# Patient Record
Sex: Male | Born: 1990 | Race: White | Hispanic: No | Marital: Single | Smoking: Current every day smoker
Health system: Southern US, Community
[De-identification: ages and names within clinical notes are randomized; demographics above are authoritative.]

## PROBLEM LIST (undated history)

## (undated) DIAGNOSIS — K769 Liver disease, unspecified: Secondary | ICD-10-CM

## (undated) DIAGNOSIS — N289 Disorder of kidney and ureter, unspecified: Secondary | ICD-10-CM

## (undated) DIAGNOSIS — F1110 Opioid abuse, uncomplicated: Secondary | ICD-10-CM

## (undated) DIAGNOSIS — K759 Inflammatory liver disease, unspecified: Secondary | ICD-10-CM

## (undated) HISTORY — PX: OTHER SURGICAL HISTORY: SHX169

## (undated) HISTORY — PX: SHOULDER SURGERY: SHX246

## (undated) HISTORY — PX: APPENDECTOMY: SHX54

---

## 2014-09-10 ENCOUNTER — Emergency Department
Admission: EM | Admit: 2014-09-10 | Discharge: 2014-09-10 | Disposition: A | Discharge status: Home / Self Care | Payer: BLUE CROSS/BLUE SHIELD | Attending: Emergency Medicine | Admitting: Emergency Medicine

## 2014-09-10 DIAGNOSIS — R197 Diarrhea, unspecified: Secondary | ICD-10-CM | POA: Diagnosis not present

## 2014-09-10 DIAGNOSIS — M791 Myalgia: Secondary | ICD-10-CM | POA: Insufficient documentation

## 2014-09-10 DIAGNOSIS — F1123 Opioid dependence with withdrawal: Secondary | ICD-10-CM | POA: Insufficient documentation

## 2014-09-10 DIAGNOSIS — Z720 Tobacco use: Secondary | ICD-10-CM | POA: Diagnosis not present

## 2014-09-10 DIAGNOSIS — Z760 Encounter for issue of repeat prescription: Secondary | ICD-10-CM | POA: Diagnosis present

## 2014-09-10 DIAGNOSIS — R112 Nausea with vomiting, unspecified: Secondary | ICD-10-CM | POA: Insufficient documentation

## 2014-09-10 DIAGNOSIS — R109 Unspecified abdominal pain: Secondary | ICD-10-CM | POA: Diagnosis not present

## 2014-09-10 DIAGNOSIS — F1193 Opioid use, unspecified with withdrawal: Secondary | ICD-10-CM

## 2014-09-10 DIAGNOSIS — R251 Tremor, unspecified: Secondary | ICD-10-CM | POA: Insufficient documentation

## 2014-09-10 HISTORY — DX: Inflammatory liver disease, unspecified: K75.9

## 2014-09-10 HISTORY — DX: Disorder of kidney and ureter, unspecified: N28.9

## 2014-09-10 HISTORY — DX: Liver disease, unspecified: K76.9

## 2014-09-10 MED ORDER — CLONIDINE HCL 0.1 MG PO TABS
ORAL_TABLET | ORAL | Status: AC
  Filled 2014-09-10: qty 1

## 2014-09-10 MED ORDER — CLONIDINE HCL 0.1 MG PO TABS
0.2000 mg | ORAL_TABLET | ORAL | Status: AC
Start: 2014-09-10 — End: 2014-09-10
  Administered 2014-09-10: 0.2 mg via ORAL

## 2014-09-10 MED ORDER — CLONIDINE HCL 0.1 MG PO TABS
ORAL_TABLET | ORAL | Status: AC
  Filled 2014-09-10: qty 2

## 2014-09-10 MED ORDER — ONDANSETRON 8 MG PO TBDP
ORAL_TABLET | ORAL | Status: AC
  Filled 2014-09-10: qty 1

## 2014-09-10 MED ORDER — PROMETHAZINE HCL 25 MG/ML IJ SOLN
INTRAMUSCULAR | Status: AC
  Filled 2014-09-10: qty 1

## 2014-09-10 MED ORDER — PROMETHAZINE HCL 25 MG/ML IJ SOLN
25.0000 mg | Freq: Once | INTRAMUSCULAR | Status: AC
  Administered 2014-09-10: 25 mg via INTRAVENOUS

## 2014-09-10 MED ORDER — ONDANSETRON 8 MG PO TBDP
8.0000 mg | ORAL_TABLET | Freq: Once | ORAL | Status: AC
  Administered 2014-09-10: 8 mg via ORAL

## 2014-09-10 NOTE — ED Notes (Signed)
Pt reports he has been on Soboxone for 4 months and has not had any since Friday. pt is extremely anxious, reports he is unable to walk despite walking into ER. Pt reports vomiting but currently drinking with NAD.

## 2014-09-10 NOTE — ED Provider Notes (Signed)
[facility] Emergency Department Provider Note  ____________________________________________  Time seen: Approximately 4:06 AM  I have reviewed the triage vital signs and the nursing notes.   HISTORY  Chief Complaint Medication Refill    HPI Ronald Pham is a 24 y.o. 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  He presents because he had his usual Suboxone on Friday (2 days ago) but did not have any for the weekend.  He has been suffering from narcotics withdrawal as a result, including nausea and vomiting, diarrhea, and pain all over his body.  He is tremulous but has no neurological deficits and no sign of delirium or altered mental status.   Past Medical History  Diagnosis Date  . Hepatitis   . Renal disorder   . Liver disease     There are no active problems to display for this patient.   Past Surgical History  Procedure Laterality Date  . Appendectomy    . Shoulder surgery    . Knee surgery bilaterally      No current outpatient prescriptions on file.  Allergies Review of patient's allergies indicates no known allergies.  No family history on file.  Social History History  Substance Use Topics  . Smoking status: Current Every Day Smoker    Types: Cigarettes  . Smokeless tobacco: Not on file  . Alcohol Use: No    Review of Systems Constitutional: Subjectively he feels hot and is sweaty.  Complains of myalgias Eyes: No visual changes. ENT: No sore throat. Cardiovascular: Denies chest pain. Respiratory: Denies shortness of breath. Gastrointestinal: Abdominal pain with nausea, vomiting, and diarrhea  Genitourinary: Negative for dysuria. Musculoskeletal: Negative for back pain. Skin: Negative for rash. Neurological: Negative for headaches, focal weakness or numbness.  10-point ROS otherwise negative.  ____________________________________________   PHYSICAL EXAM:  VITAL  SIGNS: ED Triage Vitals  Enc Vitals Group     BP 09/10/14 0321 115/90 mmHg     Pulse Rate 09/10/14 0321 89     Resp 09/10/14 0321 20     Temp 09/10/14 0321 97.6 F (36.4 C)     Temp Source 09/10/14 0321 Oral     SpO2 09/10/14 0321 96 %     Weight 09/10/14 0318 180 lb (81.647 kg)     Height 09/10/14 0318 6\' 4"  (1.93 m)     Head Cir --      Peak Flow --      Pain Score 09/10/14 0318 10     Pain Loc --      Pain Edu? --      Excl. in GC? --     Constitutional: Tremulous and in mild distress.  Grimaces in pain frequently Eyes: Conjunctivae are normal. PERRL. EOMI. Head: Atraumatic. Nose: No congestion/rhinnorhea. Mouth/Throat: Mucous membranes are moist.  Oropharynx non-erythematous. Neck: No stridor.   Cardiovascular: Normal rate, regular rhythm. Grossly normal heart sounds.  Good peripheral circulation. Respiratory: Normal respiratory effort.  No retractions. Lungs CTAB. Gastrointestinal: Soft and nontender. No distention. No abdominal bruits. No CVA tenderness. Musculoskeletal: No lower extremity tenderness nor edema.  No joint effusions. Neurologic:  Normal speech and language. No gross focal neurologic deficits are appreciated. Speech is normal. No gait instability. Skin:  Skin is warm, dry and intact. No rash noted. Psychiatric: Mood and affect are normal. Speech and behavior are normal.  ____________________________________________   LABS (all labs ordered are listed, but only abnormal results are displayed)  Not indicated   ____________________________________________   INITIAL IMPRESSION / ASSESSMENT AND PLAN / ED COURSE  Pertinent labs & imaging results that were available during my care of the patient were reviewed by me and considered in my medical decision making (see chart for details).  I explained that I will treat his symptoms with clonidine and Zofran, but that I cannot administer Suboxone nor write a prescription.  The patient understands this and will  follow up in several hours with his pharmacy.  There is no dictation for additional intervention at this time. ____________________________________________   FINAL CLINICAL IMPRESSION(S) / ED DIAGNOSES  Final diagnoses:  Acute opioid withdrawal     Loleta Roseory Harrol Novello, MD 09/10/14 [phone]

## 2014-09-10 NOTE — Discharge Instructions (Signed)
Opioid Withdrawal Opioids are a group of narcotic drugs. They include the street drug heroin. They also include pain medicines, such as morphine, hydrocodone, oxycodone, and fentanyl. Opioid withdrawal is a group of characteristic physical and mental signs and symptoms. It typically occurs if you have been using opioids daily for several weeks or longer and stop using or rapidly decrease use. Opioid withdrawal can also occur if you have used opioids daily for a long time and are given a medicine to block the effect.  SIGNS AND SYMPTOMS Opioid withdrawal includes three or more of the following symptoms:   Depressed, anxious, or irritable mood.  Nausea or vomiting.  Muscle aches or spasms.   Watery eyes.   Runny nose.  Dilated pupils, sweating, or hairs standing on end.  Diarrhea or intestinal cramping.  Yawning.   Fever.  Increased blood pressure.  Fast pulse.  Restlessness or trouble sleeping. These signs and symptoms occur within several hours of stopping or reducing short-acting opioids, such as heroin. They can occur within 3 days of stopping or reducing long-acting opioids, such as methadone. Withdrawal begins within minutes of receiving a drug that blocks the effects of opioids, such as naltrexone or naloxone. DIAGNOSIS  Opioid use disorder is diagnosed by your health care provider. You will be asked about your symptoms, drug and alcohol use, medical history, and use of medicines. A physical exam may be done. Lab tests may be ordered. Your health care provider may have you see a mental health professional.  TREATMENT  The treatment for opioid withdrawal is usually provided by medical doctors with special training in substance use disorders (addiction specialists). The following medicines may be included in treatment:  Opioids given in place of the abused opioid. They turn on opioid receptors in the brain and lessen or prevent withdrawal symptoms. They are gradually  decreased (opioid substitution and taper).  Non-opioids that can lessen certain opioid withdrawal symptoms. They may be used alone or with opioid substitution and taper. Successful long-term recovery usually requires medicine, counseling, and group support. HOME CARE INSTRUCTIONS   Take medicines only as directed by your health care provider.  Check with your health care provider before starting new medicines.  Keep all follow-up visits as directed by your health care provider. SEEK MEDICAL CARE IF:  You are not able to take your medicines as directed.  Your symptoms get worse.  You relapse. SEEK IMMEDIATE MEDICAL CARE IF:  You have serious thoughts about hurting yourself or others.  You have a seizure.  You lose consciousness. Document Released: May 12, 202004 Document Revised: 09/04/2013 Document Reviewed: 05/03/2013 [facility] Patient Information 2015 [place], [place]. This information is not intended to replace advice given to you by your health care provider. Make sure you discuss any questions you have with your health care provider.  [facility] Chronic Pain Policy  Emergency care providers appreciate that many patients coming to us are in severe pain and we wish to address their pain in the safest, most responsible manner.  It is important to recognize, however, that the proper treatment of chronic pain differs from that of the pain of injuries and acute illnesses.  Our goal is to provider quality, safe, personalized care and we thank you for giving us the opportunity to serve you.  The use of narcotics and related agents for chronic pain syndromes may lead to additional physical and psychological problems.  Nearly as many people die from prescription narcotics each year as die from car crashes.  Additionally, this  risk is increased if such prescriptions are obtained from a variety of sources.  Therefore, only your primary care physician or a pain management specialist is able to  safely treat such syndromes with narcotic medications long-term.  Documentation revealing such prescriptions have been sought from multiple sources may prohibit us from providing a refill or different narcotic medication.  Your name may be checked first through the [facility] Controlled Substances Reporting System.  This database is a record of controlled substance medication prescriptions that the patient has received.  This has been established by [facility] in an effort to eliminate the dangerous, and often life-threatening, practice of obtaining multiple prescriptions from different medical providers.  If you have a chronic pain syndrome (i.e. chronic headaches, recurrent back or neck pain, dental pain, abdominal or pelvic pain without a specific diagnosis, or neuropathic pain such as fibromyalgia) or recurrent visits for the same condition without an acute diagnosis, you may be treated with non-narcotics and other non-addictive medicines.  Allergic reactions or negative side effects that may be reported by a patient to such medications will not typically lead to the use of a narcotic analgesic or other controlled substance as an alternative.  Patients managing chronic pain with a personal physician should have provisions in place for breakthrough pain.  If you are in crisis, you should call your physician.  If your physician directs you to the emergency department, please have the doctor call and speak to our attending physician concerning your care.  When patients come to the Emergency Department (ED) with acute medical conditions in which the ED physician feels it is appropriate to prescribe narcotic or sedating pain medication, the physician will prescribe these in very limited quantities.  The amount of these medications will last only until you can see your primary care physician in his/her office.  Any patient who returns to the ED seeking refills should expect only non-narcotic pain  medications.  In the event an acute medical condition exists and the emergency physician feels it is necessary that the patient be given a narcotic or sedating medication, a responsible adult driver should be present in the room prior to the medication being given by the nurse.  Prescriptions for narcotic or sedating medications that have been lost, stolen, or expired will NOT be refilled in the ED.  Patients who have chronic pain may receive non-narcotic prescriptions until seen by their primary care physician.  It is every patient's personal responsibility to maintain active prescriptions with his or her primary care physician or specialist.

## 2017-01-05 ENCOUNTER — Ambulatory Visit: Payer: BLUE CROSS/BLUE SHIELD

## 2017-01-05 DIAGNOSIS — Z006 Encounter for examination for normal comparison and control in clinical research program: Secondary | ICD-10-CM

## 2017-05-05 ENCOUNTER — Ambulatory Visit: Payer: BLUE CROSS/BLUE SHIELD | Attending: Nephrology

## 2017-05-06 ENCOUNTER — Ambulatory Visit: Payer: BLUE CROSS/BLUE SHIELD | Attending: Nephrology

## 2017-05-06 DIAGNOSIS — M6282 Rhabdomyolysis: Secondary | ICD-10-CM

## 2017-05-06 NOTE — Progress Notes
NEPHROLOGY CONSULTATION NOTE      05/06/2017    Patient: Isaac Allen  MRN: [id number]    REFERRING PHYSICIAN: [name]*    Reason for Consultation:  Elevated Creat    Chief Complaints:   Chief Complaint   Patient presents with   ??? Establish Care     kidney failure        History of Present Illness:  27 yr male with PMH of heroin abuse recently admitted to [facility] for Rhabdomyolysis following one time IV heroin use came to renal clinic for evaluation. He mentioned that he stopped using heroin for years and he was clean till about 3 weeks ago that he used one time IV heroin and subsequently developed Rhabdo and admitted to hospital for 5 days. He did not have any RRT during his hospitalizations but mentioned he was told that his kidney was affected. Based on old records he had normal kidney function with Cr=0.8 in Sep 2018.   Currently No new complaints. Denies chest pain, shortness of breath nausea and vomiting, fever and chills, constipation and diarrhea, dysuria, frequency, hematuria, hesitancy, or other urinary symptoms         Past Medical History:  No past medical history on file.      Past Surgical History:  Heroin abuse  Family History:  No renal related family Hx  Social History:  Social drinker  Hx of heroin abuse  Allergies:  Patient has no known allergies.    Medications:  Continuous Medications:    Scheduled Medications:    PRN Medications:     Review of Systems:  A 14-point review of systems was completed and was negative other than what is presented in the HPI.    Physical Examination:  Vital Signs:   Last Recorded Vital Signs:    05/06/17 1441   BP: 116/65   Pulse: 86   Resp: 19   Temp: 36.8 ???C (98.2 ???F)   SpO2: 97%     @IOLAST3SHIFTS @  @IOTHISSHIFT @    Gen: Alert and cooperative, No acute distress   Eyes: Conjunctiva clear, sclera anicteric  Neck: Supple, no thyroid tenderness   CVS: S1/S2 normal, no tachycardia or bradycardia, no pedal edema Pulm: Clear to auscultation bilaterally, normal work of breathing  GI: Abdomen soft, nontender, no organomegaly appreciated  Extremities: No joint effusions, atraumatic, normal gait  Skin: No rashes or nodules, normal turgor, warm to touch  Neuro: CN II-XII intact, sensation intact,   Lymph: No cervical lymphadenopathy, no axillary lymphadenopathy    Laboratory Data:  Lab Results   Component Value Date    WBC 4.79 01/05/2017    HGB 13.0 (L) 01/05/2017    HCT 40.8 01/05/2017    PLT 219 01/05/2017     Lab Results   Component Value Date    NA 144 01/05/2017    K 4.6 01/05/2017    CO2 26 01/05/2017    BUN 11 01/05/2017    CREAT 0.86 01/05/2017    GLUCOSE 92 01/05/2017    CALCIUM 9.5 01/05/2017     Lab Results   Component Value Date    BUN 11 01/05/2017     Lab Results   Component Value Date    CREAT 0.86 01/05/2017     No results found for: [employer], PHUR, BLDUR, BILIUR, KETONESUR, GLUCOSEUR, PROTCLUR, NITRITEUR, LEUKESTUR, RBCSUR, WBCSUR  urine electrolytes: No results found for: Debbe Mounts, KRANUR, CLRANUR, CREATRANUR  No results found for: CKTOT, CKMB, TROPONIN  No results  found for: TPCREAT  No results found for: OSMUR  No results found for: EOSUR    Studies:   No results found for this or any previous visit.  Imaging reviewed     Assessment:  27 yr male with PMH of heroin abuse recently admitted to [facility] for Rhabdomyolysis following one time IV heroin use came to renal clinic for evaluation.    -Hx of recent Rhabdomyolysis: with possible AKI as per the Pt    -Hx of Heroin abuse    -Hx of HTN: now off medication, BP normal    Recommendations:  -check CBC, CMP, Phos, UA, UPC  -check Renal US  -check BP at home  -Recommend 0.8g/kg of protein intake for estimated gfr <60  -Avoid nephrotoxins including NSAIDs and chinese herbs  -renally dose all medications to estimated GFR  -If estimated GFR <30, avoid gadolinium exposure with MRI. -avoid iodinated contrast, if possible.  If contrast is needed, please contact us for recommendations.      RTC in 2 weeks    I spent a total of 45 minutes face to face with the patient of which greater than 50% of that time was spent in counseling/coordination.  Topics of my discussion are in my note.       Nat Christen, MD    CC: Jannet Askew, S*

## 2017-05-18 ENCOUNTER — Inpatient Hospital Stay: Payer: BLUE CROSS/BLUE SHIELD | Attending: Nephrology

## 2017-05-18 DIAGNOSIS — M6282 Rhabdomyolysis: Secondary | ICD-10-CM

## 2017-05-21 ENCOUNTER — Ambulatory Visit: Payer: BLUE CROSS/BLUE SHIELD | Attending: Nephrology

## 2017-05-21 DIAGNOSIS — M6282 Rhabdomyolysis: Secondary | ICD-10-CM

## 2017-05-21 NOTE — Progress Notes
NEPHROLOGY CONSULTATION NOTE      05/21/2017    Patient: Isaac Allen  MRN: [id number]    REFERRING PHYSICIAN: [name]*    Reason for Consultation:  Elevated Creat    Chief Complaints:   Chief Complaint   Patient presents with   ??? labs and [place]  result        History of Present Illness:  27 yr male with PMH of heroin abuse recently admitted to [facility] for Rhabdomyolysis following one time IV heroin use came to renal clinic for evaluation. He mentioned that he stopped using heroin for years and he was clean till about 3 weeks ago that he used one time IV heroin and subsequently developed Rhabdo and admitted to hospital for 5 days. He did not have any RRT during his hospitalizations but mentioned he was told that his kidney was affected. Based on old records he had normal kidney function with Cr=0.8 in Sep 2018.     -----------interval history----------  Currently No new complaints. Denies chest pain, shortness of breath nausea and vomiting, fever and chills, constipation and diarrhea, dysuria, frequency, hematuria, hesitancy, or other urinary symptoms         Past Medical History:  No past medical history on file.      Past Surgical History:  Heroin abuse  Family History:  No renal related family Hx  Social History:  Social drinker  Hx of heroin abuse  Allergies:  Patient has no known allergies.    Medications:  Continuous Medications:    Scheduled Medications:    PRN Medications:     Review of Systems:  A 14-point review of systems was completed and was negative other than what is presented in the HPI.    Physical Examination:  Vital Signs:   Last Recorded Vital Signs:    05/21/17 1421   BP: 117/66   Pulse: 78   Resp: 16   Temp: 37.1 ???C (98.7 ???F)   SpO2: 95%     @IOLAST3SHIFTS @  @IOTHISSHIFT @    Gen: Alert and cooperative, No acute distress   Eyes: Conjunctiva clear, sclera anicteric  Neck: Supple, no thyroid tenderness   CVS: S1/S2 normal, no tachycardia or bradycardia, no pedal edema Pulm: Clear to auscultation bilaterally, normal work of breathing  GI: Abdomen soft, nontender, no organomegaly appreciated  Extremities: No joint effusions, atraumatic, normal gait  Skin: No rashes or nodules, normal turgor, warm to touch  Neuro: CN II-XII intact, sensation intact,   Lymph: No cervical lymphadenopathy, no axillary lymphadenopathy    Laboratory Data:  Lab Results   Component Value Date    WBC 8.25 05/06/2017    HGB 15.8 05/06/2017    HCT 46.3 05/06/2017    PLT 325 05/06/2017     Lab Results   Component Value Date    NA 142 05/06/2017    K 4.5 05/06/2017    CO2 20 05/06/2017    BUN 14 05/06/2017    CREAT 0.93 05/06/2017    GLUCOSE 84 05/06/2017    CALCIUM 9.8 05/06/2017    PHOS 3.6 05/06/2017     Lab Results   Component Value Date    BUN 14 05/06/2017    BUN 11 01/05/2017     Lab Results   Component Value Date    CREAT 0.93 05/06/2017    CREAT 0.86 01/05/2017     Lab Results   Component Value Date    SPECGRAVUR 1.024 05/06/2017    PHUR 7.0 05/06/2017  BLDUR Negative 05/06/2017    BILIUR Negative 05/06/2017    KETONESUR 1+ (A) 05/06/2017    GLUCOSEUR Negative 05/06/2017    PROTCLUR Negative 05/06/2017    NITRITEUR Negative 05/06/2017    LEUKESTUR Negative 05/06/2017    RBCSUR 1 05/06/2017    WBCSUR 1 05/06/2017     urine electrolytes:   Lab Results   Component Value Date    CREATRANUR 84.3 05/06/2017    CREATRANUR 84.3 05/06/2017     No results found for: CKTOT, CKMB, TROPONIN  Lab Results   Component Value Date    TPCREAT 0.1 05/06/2017     No results found for: OSMUR  No results found for: EOSUR    Studies:   Renal US= unremarkable    Assessment:  27 yr male with PMH of heroin abuse recently admitted to [facility] for Rhabdomyolysis following one time IV heroin use came to renal clinic for evaluation.    -Hx of recent Rhabdomyolysis: now resolved  With Normal renal function  Normal renal US    -Hx of Heroin abuse    -Hx of elevated BP: now normal , off meds    Recommendations: -normal renal functions  -encourage enough hydration  -avoid IV drug  -avoid NSAIDs        RTC in PRN    I spent a total of 25 minutes face to face with the patient of which greater than 50% of that time was spent in counseling/coordination.  Topics of my discussion are in my note.       Nat Christen, MD    CC: Jannet Askew, S*

## 2018-07-11 ENCOUNTER — Ambulatory Visit: Payer: BLUE CROSS/BLUE SHIELD | Attending: Sports Medicine

## 2018-07-12 ENCOUNTER — Ambulatory Visit: Payer: BLUE CROSS/BLUE SHIELD | Attending: Sports Medicine

## 2018-07-18 ENCOUNTER — Inpatient Hospital Stay: Payer: BLUE CROSS/BLUE SHIELD

## 2018-07-18 ENCOUNTER — Ambulatory Visit: Payer: BLUE CROSS/BLUE SHIELD

## 2018-07-18 DIAGNOSIS — M25561 Pain in right knee: Secondary | ICD-10-CM

## 2018-07-18 DIAGNOSIS — Z719 Counseling, unspecified: Secondary | ICD-10-CM

## 2018-07-18 DIAGNOSIS — M25562 Pain in left knee: Secondary | ICD-10-CM

## 2018-07-18 DIAGNOSIS — G8929 Other chronic pain: Secondary | ICD-10-CM

## 2018-07-18 NOTE — Consults
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 Because of subluxating patellae at the age 3, he had bilateral releases arthroscopically of both knees in a [facility]. He wore a soft cast for 6 months and then he began to improve with a lessening of pain.Marland Kitchen He has never had complete, true dislocation, but tracking of his patellae was noted to be laterally.     Beginning approximately 2 years ago he began having ''cracking'' in both knees with pain. He reports also feels cracking in both ankles. At times he has symptoms in the joints of his fingers and thumb and wrist. He has been told at one time that tests have suggested a possible autoimmune condition. He reports that he very vigorously has tried an exercise program to strengthen the musculature to both lower extremities, but in spite of same he still has pain in both knees. Ascending and descending stairs particularly causes pain. Walking does not cause pain. Any physical activity with impact causes pain. He reports a month ago he had swelling of his right knee circumferentially, including the posterior aspect. He states the pain is severe in both knees, is generalized and as stated includes  the entire circumference of both knees.    EXAMINATION:  Patient demonstrates a normal gait. Examination in the stance phase reveals no malalignment. Examination of his patellae reveals lateral tracking, particularly of his right knee. There is no undue laxity on palpation of his patellae in various degrees of motion. He has minimal subpatellar crepitation of both knees. Compression test did not elicit complaints of pain. The ligamentous structures of both knees were tested at 10, 20, and 40 degrees of flexion and noted to be stable. Anterior and posterior drawers tests, Lachman test negative for instability. Range of motion of both knees is from full active extension to 0 and flexion to 140. Range of motion of the hips is without pain. He demonstrates ''cracking'' with his right ankle on repeated motion, stated to be pain-free. He has good pedal pulses.  He demonstrates excellent muscle strength of all musculature to both lower extremities including quadriceps muscles of both lower extremities.  IMAGING:  X-rays were obtained of both knees and he does have very mild narrowing of the patellofemoral compartment of the right knee. There may be very slight subluxation of the right patella laterally as seen on the sunrise view.    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    DISCUSSION:  The patient's pain is stated to be very severe in spite of his vigorous exercises and very strong appearing-quad and hamstring musculature. He likely has some patellofemoral chondromalacia as a cause of some of his pain, but as we discussed because of his other complaints of other joints he should be evaluated by a rheumatologist. At this time, certainly he is not a candidate for any additional orthopedic procedures and I encouraged him on the importance of maintaining the strength of his lower extremities, and avoiding impact   activities. I did not schedule any further appointments, but he is welcome to return if he develops any new symptoms.      Severiano Gilbert, MD [phone])        PA/MODL CONF#: [id number]  D: 07/18/2018 11:30:42 T: 07/18/2018 11:45:07 DOCUMENT: [id number]

## 2018-07-18 NOTE — Progress Notes
Primary care Physician:   Adolm Joseph, MD  [phone]  [place] [address]    Referred by:  No referring provider defined for this encounter.    Chief orthopedic complaint:  Chief Complaint   Patient presents with   ??? New Consult     bilateral knee pain        Past Medical History:   Patient No past medical history on file.    Past Surgical History:   Patient No past surgical history on file.    Family History:   Patient No family history on file.    Social History:   Patient No past surgical history on file.    Allergies:   No Known Allergies    Medication:   Current Outpatient Medications   Medication Sig   ??? gabapentin 800 mg tablet two (2) times daily.   ??? testosterone cypionate (DEPO-TESTOSTERONE) 200 mg/mL injection Inject into the muscle every fourteen (14) days.   ??? venlafaxine 150 mg 24 hr capsule two (2) times daily.     No current facility-administered medications for this visit.        Diagnosis:   1. Chronic pain of right knee  XR knee ap+lat+sunrise right (3 views)   2. Chronic pain of left knee  CANCELED: XR knee ap+lat+sunrise left (3 views)           Review of Systems;    Patient was given a 14 point review of systems check list and I reviewed the findings noted.    Imaging:    Mr Knee Right External Image Import Comparison    Result Date: 07/18/2018  This order is used to store images obtained from non [facility] imaging facilities in PACS. This order is intended for comparison purposes only and no formal result will be rendered.    Xr Knee Ap+lat+sunrise Bilat (3 Views Ea)    Result Date: 07/18/2018  XR KNEE AP LAT SUNRISE BILAT 3V INDICATION:  ''chronic pain'' COMPARISON: None     IMPRESSION: There are no acute fractures.  There is mild spurring and joint space narrowing of the right patellofemoral compartment. There is soft tissue swelling of the right anterior knee. No joint effusion is seen. I, Evangeline Gula, MD have reviewed this radiological study, and I am in full agreement with the interpretation of the report presented here.   Dictated by: Adah Perl   07/18/2018 11:05 AM Signed by: Evangeline Gula   07/18/2018 11:13 AM      Labs:        History of the present illness;    See dictated note    Orthopedic Diagnosis;  1. Chondromalacia Patella Femoral Compartment Knees    Treatment plan;      See dictated note for details;

## 2018-12-18 ENCOUNTER — Other Ambulatory Visit: Payer: Self-pay

## 2018-12-18 ENCOUNTER — Encounter: Payer: Self-pay | Admitting: Emergency Medicine

## 2018-12-18 ENCOUNTER — Emergency Department
Admission: EM | Admit: 2018-12-18 | Discharge: 2018-12-21 | Discharge status: Against Medical Advice | Payer: BLUE CROSS/BLUE SHIELD | Attending: Emergency Medicine | Admitting: Emergency Medicine

## 2018-12-18 ENCOUNTER — Emergency Department: Payer: BLUE CROSS/BLUE SHIELD

## 2018-12-18 DIAGNOSIS — F17210 Nicotine dependence, cigarettes, uncomplicated: Secondary | ICD-10-CM | POA: Insufficient documentation

## 2018-12-18 DIAGNOSIS — R112 Nausea with vomiting, unspecified: Secondary | ICD-10-CM | POA: Diagnosis not present

## 2018-12-18 DIAGNOSIS — Z5321 Procedure and treatment not carried out due to patient leaving prior to being seen by health care provider: Secondary | ICD-10-CM | POA: Diagnosis not present

## 2018-12-18 DIAGNOSIS — T404X1A Poisoning by other synthetic narcotics, accidental (unintentional), initial encounter: Secondary | ICD-10-CM | POA: Insufficient documentation

## 2018-12-18 DIAGNOSIS — R51 Headache: Secondary | ICD-10-CM | POA: Insufficient documentation

## 2018-12-18 DIAGNOSIS — T50901A Poisoning by unspecified drugs, medicaments and biological substances, accidental (unintentional), initial encounter: Secondary | ICD-10-CM

## 2018-12-18 LAB — CBC WITH DIFFERENTIAL/PLATELET
Abs Immature Granulocytes: 0.02 10*3/uL (ref 0.00–0.07)
Basophils Absolute: 0 10*3/uL (ref 0.0–0.1)
Basophils Relative: 0 %
Eosinophils Absolute: 0.1 10*3/uL (ref 0.0–0.5)
Eosinophils Relative: 1 %
HCT: 39.7 % (ref 39.0–52.0)
Hemoglobin: 13.8 g/dL (ref 13.0–17.0)
Immature Granulocytes: 0 %
Lymphocytes Relative: 14 %
Lymphs Abs: 1.1 10*3/uL (ref 0.7–4.0)
MCH: 32.5 pg (ref 26.0–34.0)
MCHC: 34.8 g/dL (ref 30.0–36.0)
MCV: 93.4 fL (ref 80.0–100.0)
Monocytes Absolute: 0.6 10*3/uL (ref 0.1–1.0)
Monocytes Relative: 9 %
Neutro Abs: 5.7 10*3/uL (ref 1.7–7.7)
Neutrophils Relative %: 76 %
Platelets: 195 10*3/uL (ref 150–400)
RBC: 4.25 MIL/uL (ref 4.22–5.81)
RDW: 11.7 % (ref 11.5–15.5)
WBC: 7.5 10*3/uL (ref 4.0–10.5)
nRBC: 0 % (ref 0.0–0.2)

## 2018-12-18 LAB — COMPREHENSIVE METABOLIC PANEL
ALT: 22 U/L (ref 0–44)
AST: 30 U/L (ref 15–41)
Albumin: 4 g/dL (ref 3.5–5.0)
Alkaline Phosphatase: 64 U/L (ref 38–126)
Anion gap: 11 (ref 5–15)
BUN: 13 mg/dL (ref 6–20)
CO2: 27 mmol/L (ref 22–32)
Calcium: 8.6 mg/dL — ABNORMAL LOW (ref 8.9–10.3)
Chloride: 97 mmol/L — ABNORMAL LOW (ref 98–111)
Creatinine, Ser: 0.98 mg/dL (ref 0.61–1.24)
GFR calc Af Amer: >60 mL/min (ref >60)
GFR calc non Af Amer: >60 mL/min (ref >60)
Glucose, Bld: 125 mg/dL — ABNORMAL HIGH (ref 70–99)
Potassium: 3.2 mmol/L — ABNORMAL LOW (ref 3.5–5.1)
Sodium: 135 mmol/L (ref 135–145)
Total Bilirubin: 1.5 mg/dL — ABNORMAL HIGH (ref 0.3–1.2)
Total Protein: 6.1 g/dL — ABNORMAL LOW (ref 6.5–8.1)

## 2018-12-18 MED ORDER — KETOROLAC TROMETHAMINE 30 MG/ML IJ SOLN
30.0000 mg | Freq: Once | INTRAMUSCULAR | Status: AC
  Administered 2018-12-18: 30 mg via INTRAVENOUS
  Filled 2018-12-18: qty 1

## 2018-12-18 MED ORDER — NALOXONE HCL 4 MG/0.1ML NA LIQD
1.0000 | Freq: Once | NASAL | Status: AC
  Administered 2018-12-18: 1 via NASAL
  Filled 2018-12-18: qty 4
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
## 2019-02-25 ENCOUNTER — Other Ambulatory Visit: Payer: Self-pay

## 2019-02-25 ENCOUNTER — Emergency Department: Payer: BLUE CROSS/BLUE SHIELD

## 2019-02-25 ENCOUNTER — Encounter: Payer: Self-pay | Admitting: Emergency Medicine

## 2019-02-25 DIAGNOSIS — Z79899 Other long term (current) drug therapy: Secondary | ICD-10-CM | POA: Insufficient documentation

## 2019-02-25 DIAGNOSIS — Z20828 Contact with and (suspected) exposure to other viral communicable diseases: Secondary | ICD-10-CM | POA: Insufficient documentation

## 2019-02-25 DIAGNOSIS — F17210 Nicotine dependence, cigarettes, uncomplicated: Secondary | ICD-10-CM | POA: Insufficient documentation

## 2019-02-25 DIAGNOSIS — R509 Fever, unspecified: Secondary | ICD-10-CM | POA: Diagnosis present

## 2019-02-25 DIAGNOSIS — B349 Viral infection, unspecified: Secondary | ICD-10-CM | POA: Diagnosis not present

## 2019-02-25 LAB — URINALYSIS, COMPLETE (UACMP) WITH MICROSCOPIC
Bacteria, UA: NONE SEEN
Bilirubin Urine: NEGATIVE
Glucose, UA: NEGATIVE mg/dL
Hgb urine dipstick: NEGATIVE
Ketones, ur: NEGATIVE mg/dL
Leukocytes,Ua: NEGATIVE
Nitrite: NEGATIVE
Protein, ur: 30 mg/dL — AB
Specific Gravity, Urine: 1.026 (ref 1.005–1.030)
Squamous Epithelial / LPF: NONE SEEN (ref 0–5)
pH: 7 (ref 5.0–8.0)

## 2019-02-25 LAB — COMPREHENSIVE METABOLIC PANEL
ALT: 238 U/L — ABNORMAL HIGH (ref 0–44)
AST: 135 U/L — ABNORMAL HIGH (ref 15–41)
Albumin: 4.3 g/dL (ref 3.5–5.0)
Alkaline Phosphatase: 51 U/L (ref 38–126)
Anion gap: 14 (ref 5–15)
BUN: 18 mg/dL (ref 6–20)
CO2: 22 mmol/L (ref 22–32)
Calcium: 9.1 mg/dL (ref 8.9–10.3)
Chloride: 101 mmol/L (ref 98–111)
Creatinine, Ser: 0.98 mg/dL (ref 0.61–1.24)
GFR calc Af Amer: >60 mL/min (ref >60)
GFR calc non Af Amer: >60 mL/min (ref >60)
Glucose, Bld: 116 mg/dL — ABNORMAL HIGH (ref 70–99)
Potassium: 3.8 mmol/L (ref 3.5–5.1)
Sodium: 137 mmol/L (ref 135–145)
Total Bilirubin: 1.7 mg/dL — ABNORMAL HIGH (ref 0.3–1.2)
Total Protein: 6.9 g/dL (ref 6.5–8.1)

## 2019-02-25 LAB — CBC WITH DIFFERENTIAL/PLATELET
Abs Immature Granulocytes: 0.03 10*3/uL (ref 0.00–0.07)
Basophils Absolute: 0 10*3/uL (ref 0.0–0.1)
Basophils Relative: 0 %
Eosinophils Absolute: 0 10*3/uL (ref 0.0–0.5)
Eosinophils Relative: 0 %
HCT: 42.3 % (ref 39.0–52.0)
Hemoglobin: 14.4 g/dL (ref 13.0–17.0)
Immature Granulocytes: 0 %
Lymphocytes Relative: 6 %
Lymphs Abs: 0.6 10*3/uL — ABNORMAL LOW (ref 0.7–4.0)
MCH: 31.4 pg (ref 26.0–34.0)
MCHC: 34 g/dL (ref 30.0–36.0)
MCV: 92.2 fL (ref 80.0–100.0)
Monocytes Absolute: 0.4 10*3/uL (ref 0.1–1.0)
Monocytes Relative: 5 %
Neutro Abs: 8.2 10*3/uL — ABNORMAL HIGH (ref 1.7–7.7)
Neutrophils Relative %: 89 %
Platelets: 151 10*3/uL (ref 150–400)
RBC: 4.59 MIL/uL (ref 4.22–5.81)
RDW: 11.6 % (ref 11.5–15.5)
WBC: 9.2 10*3/uL (ref 4.0–10.5)
nRBC: 0 % (ref 0.0–0.2)

## 2019-02-25 LAB — LACTIC ACID, PLASMA: Lactic Acid, Venous: 1.3 mmol/L (ref 0.5–1.9)

## 2019-02-25 LAB — PROTIME-INR
INR: 1.1 (ref 0.8–1.2)
Prothrombin Time: 14.1 seconds (ref 11.4–15.2)

## 2019-02-25 MED ORDER — ACETAMINOPHEN 500 MG PO TABS
1000.0000 mg | ORAL_TABLET | Freq: Once | ORAL | Status: AC
  Administered 2019-02-25: 1000 mg via ORAL
  Filled 2019-02-25: qty 2

## 2019-02-25 NOTE — ED Notes (Signed)
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

## 2019-02-25 NOTE — ED Triage Notes (Addendum)
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

## 2019-02-26 ENCOUNTER — Emergency Department: Payer: BLUE CROSS/BLUE SHIELD

## 2019-02-26 ENCOUNTER — Emergency Department
Admission: EM | Admit: 2019-02-26 | Discharge: 2019-02-26 | Disposition: A | Discharge status: Home / Self Care | Payer: BLUE CROSS/BLUE SHIELD | Attending: Emergency Medicine | Admitting: Emergency Medicine

## 2019-02-26 DIAGNOSIS — B349 Viral infection, unspecified: Secondary | ICD-10-CM

## 2019-02-26 LAB — SARS CORONAVIRUS 2 (TAT 6-24 HRS): SARS Coronavirus 2: NEGATIVE

## 2019-02-26 MED ORDER — GUAIFENESIN-CODEINE 100-10 MG/5ML PO SOLN: 5.0000 mL | Freq: Four times a day (QID) | ORAL | 0 refills | Status: AC | PRN

## 2019-02-26 MED ORDER — PROMETHAZINE HCL 25 MG PO TABS: 25.0000 mg | ORAL_TABLET | Freq: Four times a day (QID) | ORAL | 0 refills | Status: AC | PRN

## 2019-02-26 NOTE — Discharge Instructions (Signed)
You have been swab for coronavirus.  As we discussed please remain in quarantine/isolation until you know your results.  Regardless please wear a facemask and stay isolated until you are at least 3 days without fever without the use of Tylenol/ibuprofen.  Please use Tylenol or ibuprofen every 6 hours as needed for fever or discomfort as written on the box.  Please take your medications as prescribed.  Return to the emergency department for any significant difficulty breathing, or any other symptom personally concerning to yourself.

## 2019-02-26 NOTE — ED Provider Notes (Signed)
[facility] Emergency Department Provider Note  Time seen: 12:26 AM  I have reviewed the triage vital signs and the nursing notes.   HISTORY  Chief Complaint Fever, Muscle Pain, Back Pain, and Altered Mental Status   HPI Ronald Pham is a 28 y.o. male with a past medical history of liver disease, presents to the emergency department for fever, shortness of breath, nausea.  According to the patient since yesterday he has been experiencing fevers high as 104, has been nauseated and short of breath.  Denies any cough.  Denies any abdominal pain or chest pain.   Patient states he is experiencing body aches and muscle pains, is requesting a Covid test.  Patient states he has been experiencing nausea but no vomiting.  Has been experiencing loose stool for the past 2 days as well.  No known sick contacts.  Past Medical History:  Diagnosis Date  . Hepatitis   . Liver disease   . Renal disorder     There are no active problems to display for this patient.   Past Surgical History:  Procedure Laterality Date  . APPENDECTOMY    . knee surgery bilaterally    . SHOULDER SURGERY      Prior to Admission medications   Medication Sig Start Date End Date Taking? Authorizing Provider  albuterol (PROVENTIL) (2.5 MG/3ML) 0.083% nebulizer solution Take 2.5 mg by nebulization every 6 (six) hours as needed for wheezing or shortness of breath.   Yes [provider]  albuterol (VENTOLIN HFA) 108 (90 Base) MCG/ACT inhaler Inhale 2 puffs into the lungs every 6 (six) hours as needed for wheezing or shortness of breath.   Yes [provider]  buprenorphine (SUBUTEX) 8 MG SUBL SL tablet Place 16 mg under the tongue daily.   Yes [provider]  Fluticasone-Salmeterol (ADVAIR) 250-50 MCG/DOSE AEPB Inhale 1 puff into the lungs 2 (two) times daily.   Yes [provider]  montelukast (SINGULAIR) 10 MG tablet Take 10 mg by mouth at bedtime.   Yes [provider]    Allergies  Allergen Reactions  . Buspirone Nausea Only  . Naloxone Other (See Comments)    headache    History reviewed. No pertinent family history.  Social History Social History   Tobacco Use  . Smoking status: Current Every Day Smoker    Types: Cigarettes  . Smokeless tobacco: Current User    Types: Chew  Substance Use Topics  . Alcohol use: No  . Drug use: Not Currently    Review of Systems Constitutional: Positive for fever. ENT: Negative for congestion. Cardiovascular: Negative for chest pain. Respiratory: Positive for shortness of breath but negative for cough. Gastrointestinal: Negative for abdominal pain.  Positive for nausea, negative for vomiting.  Positive for loose stool. Genitourinary: Negative for urinary compaints Musculoskeletal: Positive for body aches Skin: Negative for skin complaints  Neurological: Intermittent headaches All other ROS negative  ____________________________________________   PHYSICAL EXAM:  VITAL SIGNS: ED Triage Vitals  Enc Vitals Group     BP 02/25/19 1936 134/74     Pulse Rate 02/25/19 1936 (!) 115     Resp 02/25/19 1936 16     Temp 02/25/19 1936 (!) 103.1 F (39.5 C)     Temp Source 02/25/19 1936 Oral     SpO2 02/25/19 1936 98 %     Weight 02/25/19 1935 187 lb (84.8 kg)     Height 02/25/19 1935 6\' 4"  (1.93 m)  Head Circumference --      Peak Flow --      Pain Score 02/25/19 1954 7     Pain Loc --      Pain Edu? --      Excl. in [place]? --    Constitutional: Alert and oriented. Well appearing and in no distress. Eyes: Normal exam ENT      Head: Normocephalic and atraumatic.      Mouth/Throat: Mucous membranes are moist. Cardiovascular: Normal rate, regular rhythm around 100 bpm with no murmur. Respiratory: Normal respiratory effort without tachypnea nor retractions. Breath sounds are clear, without wheeze rales or rhonchi. Gastrointestinal: Soft and nontender. No distention.    Musculoskeletal: Nontender with normal range of motion in all extremities. Neurologic:  Normal speech and language. No gross focal neurologic deficits Skin:  Skin is warm, dry and intact.  Psychiatric: Mood and affect are normal.   ____________________________________________   RADIOLOGY  Chest x-ray does not appear to show any acute abnormality.  ____________________________________________   INITIAL IMPRESSION / ASSESSMENT AND PLAN / ED COURSE  Pertinent labs & imaging results that were available during my care of the patient were reviewed by me and considered in my medical decision making (see chart for details).   Patient presents emergency department for fever body ache shortness of breath and nausea.  Patient symptoms are very suggestive of viral illness.  Patient's labs are overall reassuring with mild LFT elevation.  Normal white blood cell count.  Normal urinalysis, normal lactate.  LFTs were normal 2 months ago.  Given the LFT elevation with fever, shortness of breath, body aches and nausea along with normal white blood cell count, highly suspect COVID-19 infection.  We will swab for Covid.  I discussed continued supportive care at home.  We will prescribe a codeine cough medication in the event that he develops a cough.  We will prescribe nausea medication for the patient as well.  Discussed isolation precautions until he knows his Covid results.  Patient agreeable.  Overall patient appears well.  States he is feeling much better now that his fever is coming down.   Ronald Pham was evaluated in Emergency Department on 02/26/2019 for the symptoms described in the history of present illness. He was evaluated in the context of the global COVID-19 pandemic, which necessitated consideration that the patient might be at risk for infection with the SARS-CoV-2 virus that causes COVID-19. Institutional protocols and algorithms that pertain to the evaluation of patients at risk for COVID-19  are in a state of rapid change based on information released by regulatory bodies including the CDC and federal and state organizations. These policies and algorithms were followed during the patient's care in the ED.  ____________________________________________   FINAL CLINICAL IMPRESSION(S) / ED DIAGNOSES  Viral illness   Minna Antis, MD 02/26/19 0030

## 2019-03-02 LAB — CULTURE, BLOOD (ROUTINE X 2)
Culture: NO GROWTH
Special Requests: ADEQUATE

## 2019-04-18 ENCOUNTER — Other Ambulatory Visit: Payer: Self-pay

## 2019-04-18 ENCOUNTER — Encounter: Payer: Self-pay | Admitting: Emergency Medicine

## 2019-04-18 ENCOUNTER — Emergency Department
Admission: EM | Admit: 2019-04-18 | Discharge: 2019-04-18 | Disposition: A | Discharge status: Home / Self Care | Payer: BLUE CROSS/BLUE SHIELD | Attending: Student | Admitting: Student

## 2019-04-18 ENCOUNTER — Emergency Department
Admission: EM | Admit: 2019-04-18 | Discharge: 2019-04-18 | Disposition: A | Discharge status: Still In Hospital | Payer: BLUE CROSS/BLUE SHIELD | Source: Home / Self Care | Attending: Emergency Medicine | Admitting: Emergency Medicine

## 2019-04-18 DIAGNOSIS — T401X1A Poisoning by heroin, accidental (unintentional), initial encounter: Secondary | ICD-10-CM | POA: Insufficient documentation

## 2019-04-18 DIAGNOSIS — F17220 Nicotine dependence, chewing tobacco, uncomplicated: Secondary | ICD-10-CM | POA: Insufficient documentation

## 2019-04-18 DIAGNOSIS — Z79899 Other long term (current) drug therapy: Secondary | ICD-10-CM | POA: Insufficient documentation

## 2019-04-18 DIAGNOSIS — F17210 Nicotine dependence, cigarettes, uncomplicated: Secondary | ICD-10-CM | POA: Insufficient documentation

## 2019-04-18 HISTORY — DX: Opioid abuse, uncomplicated: F11.10

## 2019-04-18 LAB — BASIC METABOLIC PANEL
Anion gap: 12 (ref 5–15)
BUN: 16 mg/dL (ref 6–20)
CO2: 23 mmol/L (ref 22–32)
Calcium: 9 mg/dL (ref 8.9–10.3)
Chloride: 101 mmol/L (ref 98–111)
Creatinine, Ser: 1.38 mg/dL — ABNORMAL HIGH (ref 0.61–1.24)
GFR calc Af Amer: >60 mL/min (ref >60)
GFR calc non Af Amer: >60 mL/min (ref >60)
Glucose, Bld: 117 mg/dL — ABNORMAL HIGH (ref 70–99)
Potassium: 5.4 mmol/L — ABNORMAL HIGH (ref 3.5–5.1)
Sodium: 136 mmol/L (ref 135–145)

## 2019-04-18 LAB — CBC WITH DIFFERENTIAL/PLATELET
Basophils Absolute: 0 10*3/uL (ref 0.0–0.1)
Basophils Relative: 0 %
Eosinophils Absolute: 0 10*3/uL (ref 0.0–0.5)
Eosinophils Relative: 1 %
HCT: 45.8 % (ref 39.0–52.0)
Hemoglobin: 15.6 g/dL (ref 13.0–17.0)
Lymphocytes Relative: 5 %
Lymphs Abs: 0.6 10*3/uL — ABNORMAL LOW (ref 0.7–4.0)
MCH: 31.1 pg (ref 26.0–34.0)
MCHC: 34.1 g/dL (ref 30.0–36.0)
MCV: 91.4 fL (ref 80.0–100.0)
Monocytes Absolute: 0.4 10*3/uL (ref 0.1–1.0)
Monocytes Relative: 9 %
Neutro Abs: 8.2 10*3/uL — ABNORMAL HIGH (ref 1.7–7.7)
Neutrophils Relative %: 85 %
Platelets: 329 10*3/uL (ref 150–400)
RBC: 5.01 MIL/uL (ref 4.22–5.81)
RDW: 13 % (ref 11.5–15.5)
WBC: 27.7 10*3/uL — ABNORMAL HIGH (ref 4.0–10.5)
nRBC: 0 % (ref 0.0–0.2)

## 2019-04-18 LAB — URINE DRUG SCREEN, QUALITATIVE (ARMC ONLY)
Amphetamines, Ur Screen: NOT DETECTED
Barbiturates, Ur Screen: NOT DETECTED
Benzodiazepine, Ur Scrn: POSITIVE — AB
Cannabinoid 50 Ng, Ur ~~LOC~~: NOT DETECTED
Cocaine Metabolite,Ur ~~LOC~~: NOT DETECTED
MDMA (Ecstasy)Ur Screen: NOT DETECTED
Methadone Scn, Ur: NOT DETECTED
Opiate, Ur Screen: NOT DETECTED
Phencyclidine (PCP) Ur S: NOT DETECTED
Tricyclic, Ur Screen: NOT DETECTED

## 2019-04-18 LAB — ETHANOL: Alcohol, Ethyl (B): <10 mg/dL (ref <10)

## 2019-04-18 MED ORDER — ONDANSETRON HCL 4 MG PO TABS: 4.0000 mg | ORAL_TABLET | Freq: Three times a day (TID) | ORAL | 0 refills | Status: AC | PRN

## 2019-04-18 MED ORDER — NALOXONE HCL 4 MG/0.1ML NA LIQD
1.0000 | Freq: Once | NASAL | Status: AC
  Administered 2019-04-18: 1 via NASAL
  Filled 2019-04-18: qty 4

## 2019-04-18 MED ORDER — DROPERIDOL 2.5 MG/ML IJ SOLN
2.5000 mg | Freq: Once | INTRAMUSCULAR | Status: AC
  Administered 2019-04-18: 2.5 mg via INTRAVENOUS
  Filled 2019-04-18: qty 2

## 2019-04-18 MED ORDER — SODIUM CHLORIDE 0.9 % IV BOLUS
1000.0000 mL | Freq: Once | INTRAVENOUS | Status: AC
  Administered 2019-04-18: 1000 mL via INTRAVENOUS

## 2019-04-18 MED ORDER — ONDANSETRON 4 MG PO TBDP
4.0000 mg | ORAL_TABLET | Freq: Once | ORAL | Status: AC
  Administered 2019-04-18: 4 mg via ORAL
  Filled 2019-04-18: qty 1

## 2019-04-18 NOTE — ED Provider Notes (Signed)
10:13 AM Patient is awake, alert.  Hemodynamically stable, normal respiratory rate, no hypoxia or apnea.  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  He states he previously gained sobriety in [place], but resides here.  We will provide him with Narcan to take home, as well as resources for assistance with continued detox.  Discussed the importance of cessation.  He voices understanding of this.  Stable for discharge.  Given return precautions.   Lilia Pro., MD 04/18/19 [phone]

## 2019-04-18 NOTE — Discharge Instructions (Addendum)
You have been seen in the Emergency Department (ED) today for an accidental heroin overdose.  If you continue using heroin and/or other illegal substances, you will eventually end up dead.  You are the only one who can change your lifestyle. You have been provided with some resources that may help, and if you have any outpatient physicians or therapists, they may also help provide additional resources.  You were also provided with a home Narcan kit.  Keep it with you and let your friends and family know you have one. If you overdose again, they can administer the medication in your nose before calling 911, but you still must come to the Emergency Department because the medication will wear off and you could stop breathing again.  Please take any prescriptions that have been provided as indicated on the instructions.  Please return to the ED immediately if you have ANY thoughts of hurting yourself or anyone else, so that we may help you.  Follow up with your doctor and/or therapist as soon as possible regarding today's ED visit.   Please follow up any other recommendations and clinic appointments provided by the psychiatry team that saw you in the Emergency Department.  Please contact [facility] for additional mental health/psychiatric assistance:  [facility] [address] [place], [place] [address] Phone:  [phone] or [phone]  Open Access:   Walk-in ASSESSMENT hours, M-W-F, 8:00am - 3:00pm Advanced Acess CRISIS:  M-F, 8:00am - 8:00pm Outpatient Services Office Hours:  M-F, 8:00am - 5:00pm  
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
## 2019-04-18 NOTE — ED Notes (Signed)
Called patient's wife Quillian Quince [phone] with patient's permission. Told wife patient is here in ED.

## 2019-04-18 NOTE — ED Notes (Signed)
Patient gave permission to call wife Quillian Quince [phone]. Updated wife that patient is here in the ED.

## 2019-04-18 NOTE — ED Notes (Signed)
Patient placed on 2 liter O2 due to O2 dropping into upper 80's. EDP made aware.

## 2019-04-18 NOTE — ED Provider Notes (Signed)
This was an erroneous record created at the time of registration.  The actual patient has the following MRN and has my ED Provider Note for the encounter on 04/18/2019: [id number]    Hinda Kehr, MD 04/18/19 [phone]
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
## 2019-04-19 ENCOUNTER — Encounter: Payer: Self-pay | Admitting: Emergency Medicine

## 2019-04-21 ENCOUNTER — Emergency Department
Admission: EM | Admit: 2019-04-21 | Discharge: 2019-04-21 | Disposition: A | Discharge status: Home / Self Care | Payer: BLUE CROSS/BLUE SHIELD | Attending: Student in an Organized Health Care Education/Training Program | Admitting: Student in an Organized Health Care Education/Training Program

## 2019-04-21 DIAGNOSIS — F17220 Nicotine dependence, chewing tobacco, uncomplicated: Secondary | ICD-10-CM | POA: Diagnosis not present

## 2019-04-21 DIAGNOSIS — T401X1A Poisoning by heroin, accidental (unintentional), initial encounter: Secondary | ICD-10-CM | POA: Diagnosis not present

## 2019-04-21 DIAGNOSIS — Z79899 Other long term (current) drug therapy: Secondary | ICD-10-CM | POA: Diagnosis not present

## 2019-04-21 DIAGNOSIS — F17210 Nicotine dependence, cigarettes, uncomplicated: Secondary | ICD-10-CM | POA: Diagnosis not present

## 2019-04-21 MED ORDER — BUPROPION HCL ER (XL) 300 MG PO TB24: 300.0000 mg | ORAL_TABLET | ORAL | 0 refills | Status: AC

## 2019-04-21 MED ORDER — ESCITALOPRAM OXALATE 20 MG PO TABS: 20.0000 mg | ORAL_TABLET | Freq: Every day | ORAL | 0 refills | Status: AC

## 2019-04-21 NOTE — ED Triage Notes (Signed)
Pt brought in with [facility] EMS after heroin overdose.  Pt given narcan en route and arrives alert and oriented.

## 2019-04-21 NOTE — ED Notes (Addendum)
Pt discharged home. VS stable.  Pt denies SI.  Discharge instructions and prescriptions reviewed with patient. Pt given referral information for [facility]. Pt signed for discharge.

## 2019-04-21 NOTE — ED Provider Notes (Signed)
[facility] Provider Note    First MD Initiated Contact with Patient 04/21/19 0900     (approximate)  I have reviewed the triage vital signs and the nursing notes.   HISTORY  Chief Complaint Drug Overdose    HPI Ronald Pham is a 28 y.o. male presents to the ER after accidental heroin overdose.  Patient has a longstanding history of polysubstance abuse and narcotic dependence previously getting clean [place]  States that he relapsed early this morning.  States he intentionally took less than he would have because he did not want to overdose.  States he was just trying to get high.  No SI or HI.  Feels well now.  By first responders and was given dose of Narcan on initial evaluation.    Past Medical History:  Diagnosis Date  . Hepatitis   . Heroin abuse (HCC)   . Liver disease   . Renal disorder    No family history on file. Past Surgical History:  Procedure Laterality Date  . APPENDECTOMY    . knee surgery bilaterally    . SHOULDER SURGERY     There are no problems to display for this patient.     Prior to Admission medications   Medication Sig Start Date End Date Taking? Authorizing Provider  albuterol (PROVENTIL) (2.5 MG/3ML) 0.083% nebulizer solution Take 2.5 mg by nebulization every 6 (six) hours as needed for wheezing or shortness of breath.    [provider]  albuterol (VENTOLIN HFA) 108 (90 Base) MCG/ACT inhaler Inhale 2 puffs into the lungs every 6 (six) hours as needed for wheezing or shortness of breath.    [provider]  buprenorphine (SUBUTEX) 8 MG SUBL SL tablet Place 16 mg under the tongue daily.    [provider]  buPROPion (WELLBUTRIN XL) 300 MG 24 hr tablet Take 1 tablet (300 mg total) by mouth every morning. 04/21/19 04/20/20  Willy Eddyobinson, Devan Danzer, MD  escitalopram (LEXAPRO) 20 MG tablet Take 1 tablet (20 mg total) by mouth  daily. 04/21/19 04/20/20  Willy Eddyobinson, Leda [place], MD  Fluticasone-Salmeterol (ADVAIR) 250-50 MCG/DOSE AEPB Inhale 1 puff into the lungs 2 (two) times daily.    [provider]  [facility] N[facility]INE 14 MG/24HR patch 14 mg daily. 04/10/19   [provider]  guaiFENesin-codeine 100-10 MG/5ML syrup Take 5 mLs by mouth every 6 (six) hours as needed for cough. 02/26/19   Minna AntisPaduchowski, Kevin, MD  montelukast (SINGULAIR) 10 MG tablet Take 10 mg by mouth at bedtime.    [provider]  ondansetron (ZOFRAN) 4 MG tablet Take 1 tablet (4 mg total) by mouth every 8 (eight) hours as needed for up to 5 days for nausea or vomiting. 04/18/19 11/20/18  Miguel AschoffMonks, Sarah L., MD  promethazine (PHENERGAN) 25 MG tablet Take 1 tablet (25 mg total) by mouth every 6 (six) hours as needed for nausea or vomiting. 02/26/19   Minna AntisPaduchowski, Kevin, MD  QUEtiapine (SEROQUEL) 50 MG tablet Take 50 mg by mouth at bedtime. 03/20/19   [provider]  sertraline (ZOLOFT) 50 MG tablet Take 50 mg by mouth daily. 03/19/19   [provider]  tiZANidine (ZANAFLEX) 4 MG tablet Take 4 mg by mouth 3 (three) times daily. 03/31/19   [provider]    Allergies Buspirone and Naloxone    Social History Social History   Tobacco Use  . Smoking status: Current Every Day Smoker  Types: Cigarettes  . Smokeless tobacco: Current User    Types: Chew  Substance Use Topics  . Alcohol use: No  . Drug use: Yes    Types: IV    Comment: heroin    Review of Systems Patient denies headaches, rhinorrhea, blurry vision, numbness, shortness of breath, chest pain, edema, cough, abdominal pain, nausea, vomiting, diarrhea, dysuria, fevers, rashes or hallucinations unless otherwise stated above in HPI. ____________________________________________   PHYSICAL EXAM:  VITAL SIGNS: Vitals:   04/21/19 0901  BP: (!) 137/99  Pulse: 82  Resp: 18  Temp: 98.2 F (36.8 C)  SpO2: 99%    Constitutional: Alert and  oriented.  Eyes: Conjunctivae are normal.  Head: Atraumatic. Nose: No congestion/rhinnorhea. Mouth/Throat: Mucous membranes are moist.   Neck: No stridor. Painless ROM.  Cardiovascular: Normal rate, regular rhythm. Grossly normal heart sounds.  Good peripheral circulation. Respiratory: Normal respiratory effort.  No retractions. Lungs CTAB. Gastrointestinal: Soft and nontender. No distention. No abdominal bruits. No CVA tenderness. Genitourinary:  Musculoskeletal: No lower extremity tenderness nor edema.  No joint effusions. Neurologic:  Normal speech and language. No gross focal neurologic deficits are appreciated. No facial droop Skin:  Skin is warm, dry and intact. No rash noted. Psychiatric: Mood and affect are normal. Speech and behavior are normal.  ____________________________________________   LABS (all labs ordered are listed, but only abnormal results are displayed)  No results found for this or any previous visit (from the past 24 hour(s)). ____________________________________________ __________________________   PROCEDURES  Procedure(s) performed:  Procedures    Critical Care performed: no ____________________________________________   INITIAL IMPRESSION / ASSESSMENT AND PLAN / ED COURSE  Pertinent labs & imaging results that were available during my care of the patient were reviewed by me and considered in my medical decision making (see chart for details).   DDX: Substance abuse, overdose, narcotic dependence  Ronald Pham is a 28 y.o. who presents to the ED with narcotic dependence status post recent evaluation management and detox facility presents the ER for accidental overdose receiving 1 dose of Narcan.  Has been observed in the ER remains well.  Clinically sober.  Denies any SI or HI.  This was an accidental overdose.  He is requesting short refill on his chronic medications for anxiety that are Wellbutrin as well as Lexapro.  States that he  ran out those medications 2 days ago and is also requesting refill for all to local psychiatric clinic.  Will give short refill on those chronic medications.  Have discussed with the patient and available family all diagnostics and treatments performed thus far and all questions were answered to the best of my ability. The patient demonstrates understanding and agreement with plan.      The patient was evaluated in Emergency Department today for the symptoms described in the history of present illness. He/she was evaluated in the context of the global COVID-19 pandemic, which necessitated consideration that the patient might be at risk for infection with the SARS-CoV-2 virus that causes COVID-19. Institutional protocols and algorithms that pertain to the evaluation of patients at risk for COVID-19 are in a state of rapid change based on information released by regulatory bodies including the CDC and federal and state organizations. These policies and algorithms were followed during the patient's care in the ED.  As part of my medical decision making, I reviewed the following data within the [place] notes reviewed and incorporated, Labs reviewed, notes from prior ED visits  and [place] Controlled Substance Database   ____________________________________________   FINAL CLINICAL IMPRESSION(S) / ED DIAGNOSES  Final diagnoses:  Accidental overdose of heroin, initial encounter (HCC)      NEW MEDICATIONS STARTED DURING THIS VISIT:  New Prescriptions   BUPROPION (WELLBUTRIN XL) 300 MG 24 HR TABLET    Take 1 tablet (300 mg total) by mouth every morning.   ESCITALOPRAM (LEXAPRO) 20 MG TABLET    Take 1 tablet (20 mg total) by mouth daily.     Note:  This document was prepared using Dragon voice recognition software and may include unintentional dictation errors.    Willy Eddy, MD 04/21/19 [phone]

## 2019-04-21 NOTE — ED Notes (Signed)
Pt states his overdose was accidental and he had no intention of harming himself. Alert and oriented x 4. Pt denies pain.  Pt reports he needs a referral to a psychiatrist so he can get his prescribed psychiatric medications refilled.  Pt expressed he would like to be discharged as soon as possible so he does not loose his job.

## 2019-05-05 DEATH — deceased
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
## 2021-08-19 IMAGING — DX DG CHEST 1V PORT
2 series · 2 of 2 positions shown · non-contrast
Comparison: None.

CLINICAL DATA: Cough and fever

EXAM:
PORTABLE CHEST 1 VIEW

[chest ap (1 of 2)]
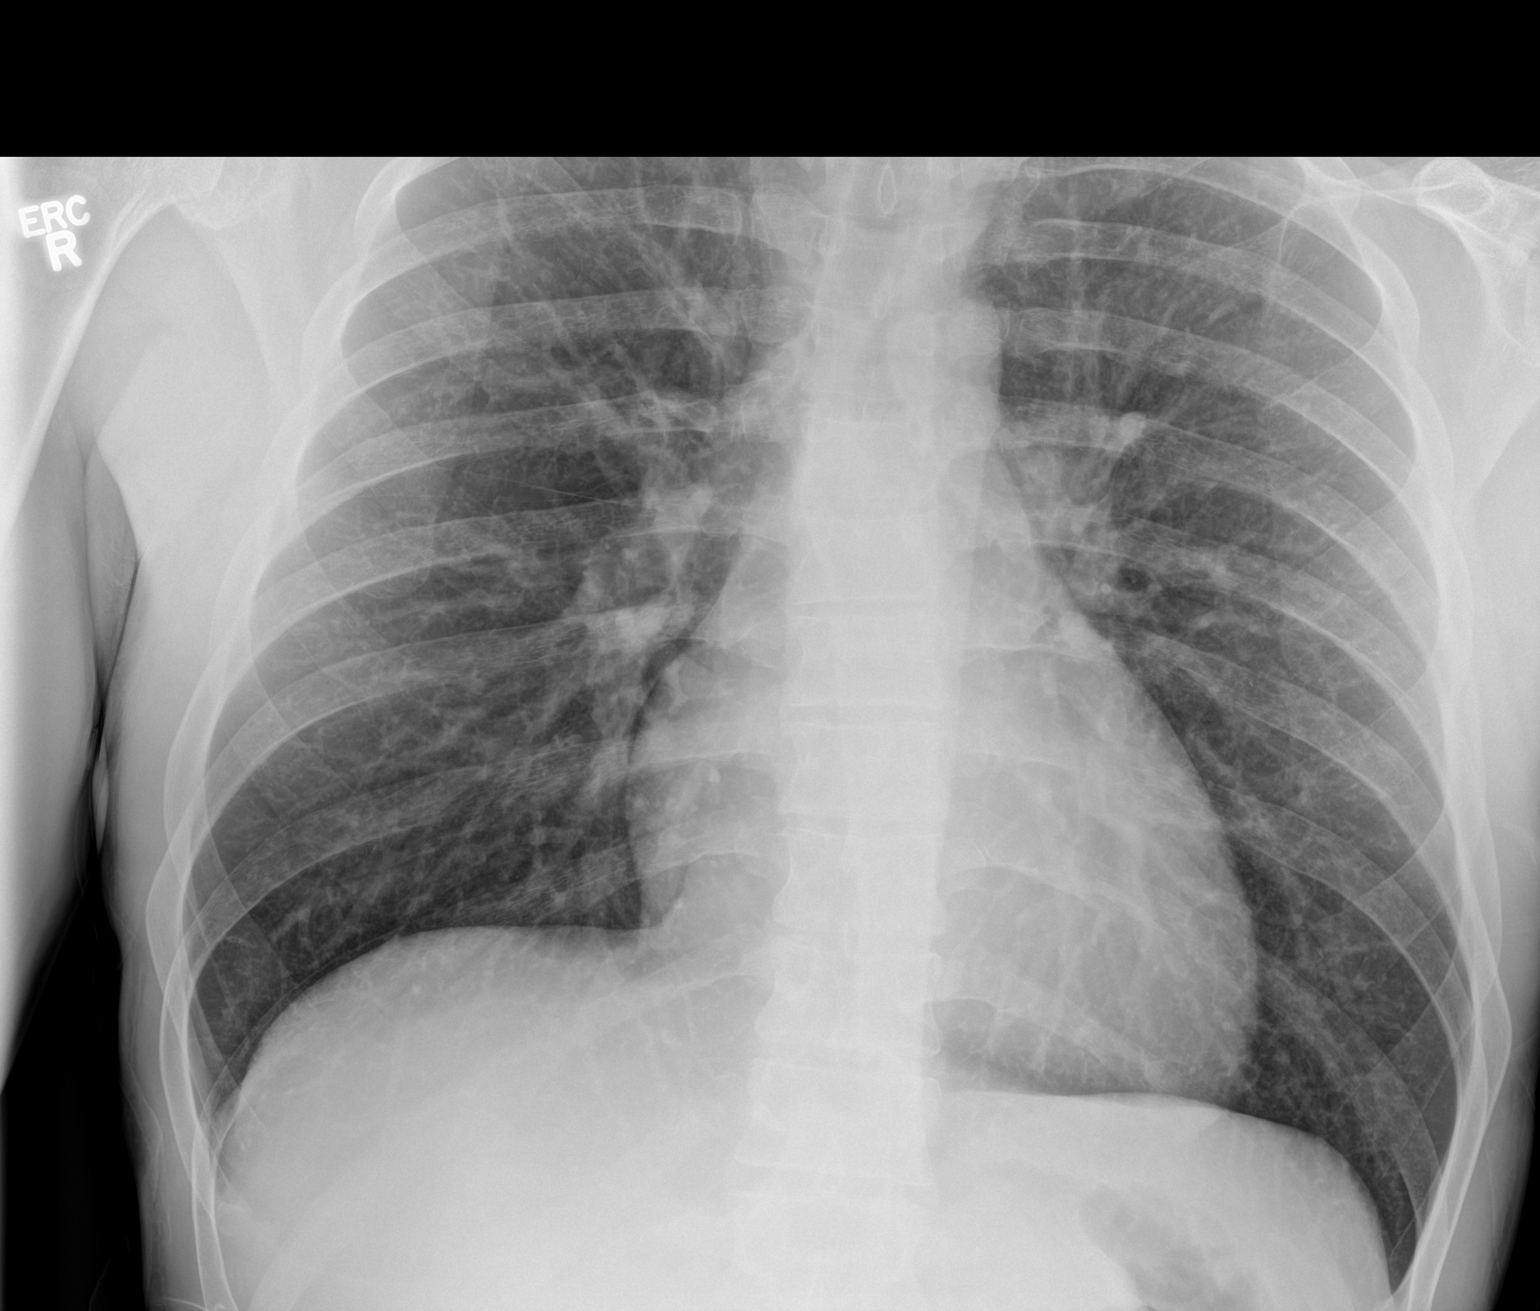

[chest ap (2 of 2)]
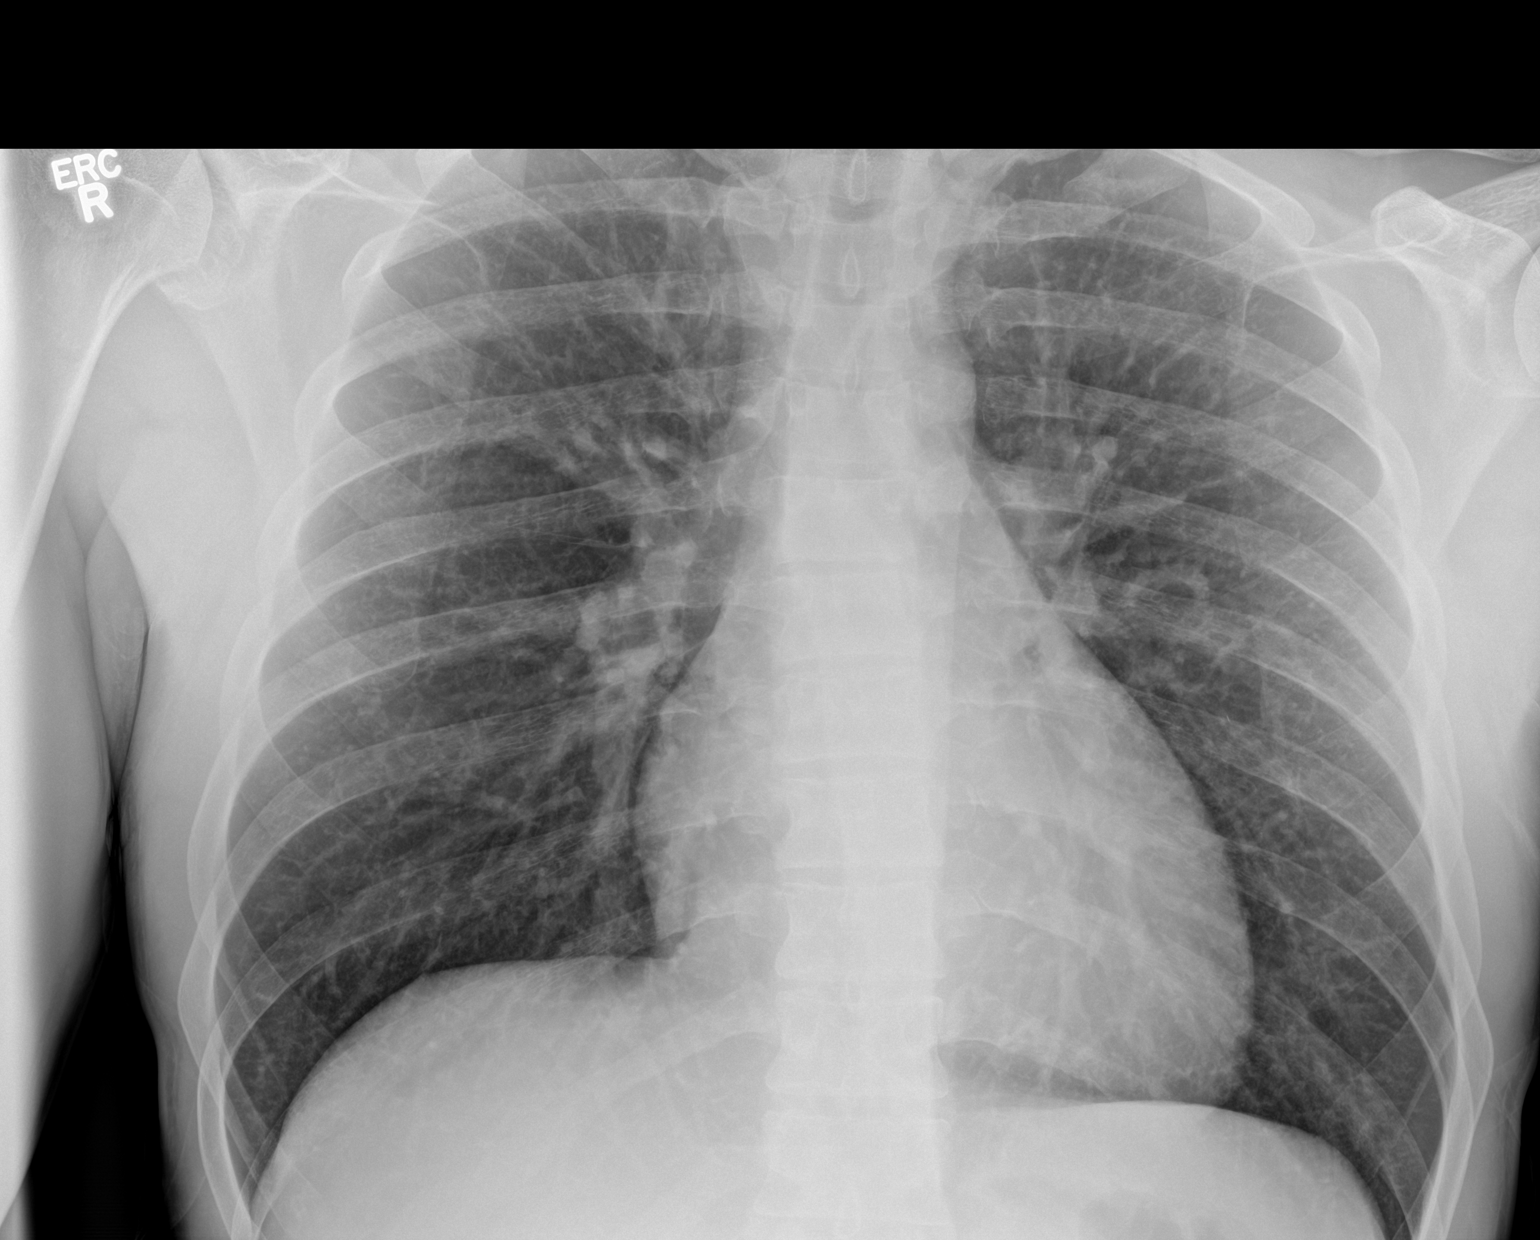

[2 of 2 positions shown; findings below may reference images not displayed]

FINDINGS: The heart size and mediastinal contours are within normal limits.
Both lungs are clear. The visualized skeletal structures are
unremarkable.
IMPRESSION: No acute cardiopulmonary process.
